# Patient Record
Sex: Female | Born: 1935 | Race: White | Hispanic: No | Smoking: Former smoker
Health system: Southern US, Community
[De-identification: ages and names within clinical notes are randomized; demographics above are authoritative.]

## PROBLEM LIST (undated history)

## (undated) DIAGNOSIS — C50919 Malignant neoplasm of unspecified site of unspecified female breast: Secondary | ICD-10-CM

## (undated) DIAGNOSIS — I10 Essential (primary) hypertension: Secondary | ICD-10-CM

## (undated) DIAGNOSIS — M24159 Other articular cartilage disorders, unspecified hip: Secondary | ICD-10-CM

## (undated) HISTORY — DX: Malignant neoplasm of unspecified site of unspecified female breast: C50.919

## (undated) HISTORY — PX: TONSILLECTOMY: SUR1361

## (undated) HISTORY — DX: Essential (primary) hypertension: I10

## (undated) HISTORY — DX: Other articular cartilage disorders, unspecified hip: M24.159

## (undated) HISTORY — PX: APPENDECTOMY: SHX54

## (undated) HISTORY — PX: BREAST SURGERY: SHX581

---

## 1991-10-19 DIAGNOSIS — C50919 Malignant neoplasm of unspecified site of unspecified female breast: Secondary | ICD-10-CM

## 1991-10-19 HISTORY — DX: Malignant neoplasm of unspecified site of unspecified female breast: C50.919

## 2009-01-22 ENCOUNTER — Ambulatory Visit: Payer: Self-pay | Admitting: Diagnostic Radiology

## 2009-01-22 ENCOUNTER — Ambulatory Visit (HOSPITAL_BASED_OUTPATIENT_CLINIC_OR_DEPARTMENT_OTHER): Admission: RE | Admit: 2009-01-22 | Discharge: 2009-01-22 | Payer: Self-pay | Admitting: Family Medicine

## 2010-10-24 ENCOUNTER — Emergency Department (HOSPITAL_COMMUNITY)
Admission: EM | Admit: 2010-10-24 | Discharge: 2010-10-24 | Disposition: A | Discharge status: Other Acute Inpatient Hospital | Payer: Self-pay | Source: Home / Self Care | Admitting: Emergency Medicine

## 2010-11-02 LAB — COMPREHENSIVE METABOLIC PANEL
ALT: 21 U/L (ref 0–35)
AST: 23 U/L (ref 0–37)
Albumin: 3.8 g/dL (ref 3.5–5.2)
Alkaline Phosphatase: 73 U/L (ref 39–117)
BUN: 19 mg/dL (ref 6–23)
CO2: 26 mEq/L (ref 19–32)
Calcium: 9.7 mg/dL (ref 8.4–10.5)
Chloride: 101 mEq/L (ref 96–112)
Creatinine, Ser: 1.06 mg/dL (ref 0.4–1.2)
GFR calc Af Amer: >60 mL/min (ref >60)
GFR calc non Af Amer: 50 mL/min — ABNORMAL LOW (ref >60)
Glucose, Bld: 136 mg/dL — ABNORMAL HIGH (ref 70–99)
Potassium: 4 mEq/L (ref 3.5–5.1)
Sodium: 138 mEq/L (ref 135–145)
Total Bilirubin: 0.8 mg/dL (ref 0.3–1.2)
Total Protein: 7.5 g/dL (ref 6.0–8.3)

## 2010-11-02 LAB — CBC
HCT: 41.2 % (ref 36.0–46.0)
Hemoglobin: 14.2 g/dL (ref 12.0–15.0)
MCH: 31.6 pg (ref 26.0–34.0)
MCHC: 34.5 g/dL (ref 30.0–36.0)
MCV: 91.6 fL (ref 78.0–100.0)
Platelets: 290 10*3/uL (ref 150–400)
RBC: 4.5 MIL/uL (ref 3.87–5.11)
RDW: 12.9 % (ref 11.5–15.5)
WBC: 10.2 10*3/uL (ref 4.0–10.5)

## 2010-11-02 LAB — URINALYSIS, ROUTINE W REFLEX MICROSCOPIC
Bilirubin Urine: NEGATIVE
Nitrite: POSITIVE — AB
Protein, ur: NEGATIVE mg/dL
Specific Gravity, Urine: 1.016 (ref 1.005–1.030)
Urine Glucose, Fasting: NEGATIVE mg/dL
Urobilinogen, UA: 0.2 mg/dL (ref 0.0–1.0)
pH: 6.5 (ref 5.0–8.0)

## 2010-11-02 LAB — TYPE AND SCREEN
ABO/RH(D): O POS
Antibody Screen: NEGATIVE

## 2010-11-02 LAB — URINE MICROSCOPIC-ADD ON

## 2010-11-02 LAB — URINE CULTURE
Colony Count: >=100000
Culture  Setup Time: 201201080027

## 2010-11-02 LAB — DIFFERENTIAL
Basophils Absolute: 0 10*3/uL (ref 0.0–0.1)
Basophils Relative: 0 % (ref 0–1)
Eosinophils Absolute: 0.2 10*3/uL (ref 0.0–0.7)
Eosinophils Relative: 2 % (ref 0–5)
Lymphocytes Relative: 11 % — ABNORMAL LOW (ref 12–46)
Lymphs Abs: 1.1 10*3/uL (ref 0.7–4.0)
Monocytes Absolute: 0.7 10*3/uL (ref 0.1–1.0)
Monocytes Relative: 6 % (ref 3–12)
Neutro Abs: 8.2 10*3/uL — ABNORMAL HIGH (ref 1.7–7.7)
Neutrophils Relative %: 80 % — ABNORMAL HIGH (ref 43–77)

## 2010-11-02 LAB — ABO/RH: ABO/RH(D): O POS

## 2011-11-30 ENCOUNTER — Encounter: Payer: Self-pay | Admitting: Internal Medicine

## 2012-01-28 ENCOUNTER — Encounter: Payer: Self-pay | Admitting: Internal Medicine

## 2012-05-16 ENCOUNTER — Encounter: Payer: Self-pay | Admitting: Internal Medicine

## 2012-05-16 ENCOUNTER — Ambulatory Visit (INDEPENDENT_AMBULATORY_CARE_PROVIDER_SITE_OTHER): Payer: Medicare Other | Admitting: Internal Medicine

## 2012-05-16 VITALS — BP 150/70 | HR 76 | Ht 63.75 in | Wt 129.4 lb

## 2012-05-16 DIAGNOSIS — Z1211 Encounter for screening for malignant neoplasm of colon: Secondary | ICD-10-CM

## 2012-05-16 NOTE — Patient Instructions (Addendum)
Dr Leonette Most

## 2012-05-16 NOTE — Progress Notes (Signed)
Andrea Lam 11/02/1928 MRN [id number]   History of Present Illness:  This is an 78 year old white female who is here to discuss having a recall colonoscopy. She had her last screening colonoscopy in July 2003. She had moderately severe diverticulosis of the left colon. There were no polyps. There is no family history of colon cancer. Her bowel habits are regular. She denies rectal bleeding. She had a regular physical exam by Dr. Leonette Most. Her next one is scheduled in September 2013. She denies being anemic.   Past Medical History  Diagnosis Date  . Hypertension   . Articular cartilage disorder of hip     left  . Breast cancer 1993    left   Past Surgical History  Procedure Date  . Breast surgery     removed left breast  . Appendectomy   . Tonsillectomy     reports that she quit smoking about 70 years ago. She has never used smokeless tobacco. She reports that she drinks alcohol. She reports that she does not use illicit drugs. family history includes Breast cancer in her sister; Diabetes in her sister; Heart disease in her father; and Hypertension in her mother.  There is no history of Colon cancer. No Known Allergies      Review of Systems:  The remainder of the 10 point ROS is negative except as outlined in H&P   Physical Exam: General appearance  Well developed, in no distress. Eyes- non icteric. HEENT nontraumatic, normocephalic. Mouth no lesions, tongue papillated, no cheilosis. Neck supple without adenopathy, thyroid not enlarged, no carotid bruits, no JVD. Lungs Clear to auscultation bilaterally. Cor normal S1, normal S2, regular rhythm, no murmur,  quiet precordium. Abdomen: Soft nontender with normal active bowel sounds. No distention. No bruit. Liver edge at costal margin. Rectal: Normal rectal sphincter tone. Soft Hemoccult negative stool. Extremities no pedal edema. Skin no lesions. Neurological alert and oriented x 3. Psychological normal mood and  affect.  Assessment and Plan:  Problem #26 78 year old white female who is asymptomatic and due for a recall colonoscopy. She is in good health but is not sure that she wants another colonoscopy. She is Hemoccult negative. I have asked her to discuss it with Dr Leonette Most at her next appointment. I have discussed the benefits as well as the risks of colonoscopy. At her age, it is her choice whether she wants to proceed with colonoscopy. I told her that I cannot be sure that she may not have colon cancer or a dysplastic polyp and she asked what symptoms she should anticipate if there was a problem. I explained that the purpose of screening colonoscopy is to prevent  Problems rather than wait for the symptoms. She will call us back if she is interested in pursuing a colonoscopy.    05/16/2012 Lina Sar

## 2013-11-03 ENCOUNTER — Emergency Department (HOSPITAL_BASED_OUTPATIENT_CLINIC_OR_DEPARTMENT_OTHER): Payer: Medicare HMO

## 2013-11-03 ENCOUNTER — Encounter (HOSPITAL_BASED_OUTPATIENT_CLINIC_OR_DEPARTMENT_OTHER): Payer: Self-pay | Admitting: Emergency Medicine

## 2013-11-03 ENCOUNTER — Emergency Department (HOSPITAL_BASED_OUTPATIENT_CLINIC_OR_DEPARTMENT_OTHER)
Admission: EM | Admit: 2013-11-03 | Discharge: 2013-11-04 | Payer: Medicare HMO | Attending: Emergency Medicine | Admitting: Emergency Medicine

## 2013-11-03 DIAGNOSIS — J3489 Other specified disorders of nose and nasal sinuses: Secondary | ICD-10-CM | POA: Insufficient documentation

## 2013-11-03 DIAGNOSIS — Z79899 Other long term (current) drug therapy: Secondary | ICD-10-CM | POA: Insufficient documentation

## 2013-11-03 DIAGNOSIS — Z853 Personal history of malignant neoplasm of breast: Secondary | ICD-10-CM | POA: Insufficient documentation

## 2013-11-03 DIAGNOSIS — R778 Other specified abnormalities of plasma proteins: Secondary | ICD-10-CM

## 2013-11-03 DIAGNOSIS — R7989 Other specified abnormal findings of blood chemistry: Secondary | ICD-10-CM | POA: Insufficient documentation

## 2013-11-03 DIAGNOSIS — J40 Bronchitis, not specified as acute or chronic: Secondary | ICD-10-CM | POA: Insufficient documentation

## 2013-11-03 DIAGNOSIS — R509 Fever, unspecified: Secondary | ICD-10-CM | POA: Insufficient documentation

## 2013-11-03 DIAGNOSIS — R000 Tachycardia, unspecified: Secondary | ICD-10-CM | POA: Insufficient documentation

## 2013-11-03 DIAGNOSIS — Z7982 Long term (current) use of aspirin: Secondary | ICD-10-CM | POA: Insufficient documentation

## 2013-11-03 DIAGNOSIS — Z87891 Personal history of nicotine dependence: Secondary | ICD-10-CM | POA: Insufficient documentation

## 2013-11-03 DIAGNOSIS — I10 Essential (primary) hypertension: Secondary | ICD-10-CM | POA: Insufficient documentation

## 2013-11-03 DIAGNOSIS — M542 Cervicalgia: Secondary | ICD-10-CM | POA: Insufficient documentation

## 2013-11-03 LAB — COMPREHENSIVE METABOLIC PANEL
ALT: 12 U/L (ref 0–35)
AST: 23 U/L (ref 0–37)
Albumin: 3.8 g/dL (ref 3.5–5.2)
Alkaline Phosphatase: 78 U/L (ref 39–117)
BUN: 15 mg/dL (ref 6–23)
CO2: 25 mEq/L (ref 19–32)
Calcium: 9.4 mg/dL (ref 8.4–10.5)
Chloride: 102 mEq/L (ref 96–112)
Creatinine, Ser: 0.9 mg/dL (ref 0.50–1.10)
GFR calc Af Amer: 67 mL/min — ABNORMAL LOW (ref >90)
GFR calc non Af Amer: 58 mL/min — ABNORMAL LOW (ref >90)
Glucose, Bld: 118 mg/dL — ABNORMAL HIGH (ref 70–99)
Potassium: 3.7 mEq/L (ref 3.7–5.3)
Sodium: 143 mEq/L (ref 137–147)
Total Bilirubin: 0.6 mg/dL (ref 0.3–1.2)
Total Protein: 7.7 g/dL (ref 6.0–8.3)

## 2013-11-03 LAB — TROPONIN I
TROPONIN I: 0.32 ng/mL — AB (ref <0.30)
Troponin I: 0.31 ng/mL (ref <0.30)

## 2013-11-03 LAB — CBC WITH DIFFERENTIAL/PLATELET
Basophils Absolute: 0 10*3/uL (ref 0.0–0.1)
Basophils Relative: 0 % (ref 0–1)
Eosinophils Absolute: 0 10*3/uL (ref 0.0–0.7)
Eosinophils Relative: 0 % (ref 0–5)
HCT: 43.1 % (ref 36.0–46.0)
Hemoglobin: 14.3 g/dL (ref 12.0–15.0)
Lymphocytes Relative: 6 % — ABNORMAL LOW (ref 12–46)
Lymphs Abs: 0.4 10*3/uL — ABNORMAL LOW (ref 0.7–4.0)
MCH: 30.2 pg (ref 26.0–34.0)
MCHC: 33.2 g/dL (ref 30.0–36.0)
MCV: 90.9 fL (ref 78.0–100.0)
Monocytes Absolute: 1 10*3/uL (ref 0.1–1.0)
Monocytes Relative: 13 % — ABNORMAL HIGH (ref 3–12)
Neutro Abs: 6.1 10*3/uL (ref 1.7–7.7)
Neutrophils Relative %: 81 % — ABNORMAL HIGH (ref 43–77)
Platelets: 240 10*3/uL (ref 150–400)
RBC: 4.74 MIL/uL (ref 3.87–5.11)
RDW: 13.6 % (ref 11.5–15.5)
WBC: 7.5 10*3/uL (ref 4.0–10.5)

## 2013-11-03 LAB — D-DIMER, QUANTITATIVE: D-Dimer, Quant: 0.98 ug/mL-FEU — ABNORMAL HIGH (ref 0.00–0.48)

## 2013-11-03 MED ORDER — ASPIRIN 81 MG PO CHEW
324.0000 mg | CHEWABLE_TABLET | Freq: Once | ORAL | Status: AC
  Administered 2013-11-03: 324 mg via ORAL
  Filled 2013-11-03: qty 4

## 2013-11-03 MED ORDER — ALBUTEROL SULFATE HFA 108 (90 BASE) MCG/ACT IN AERS
2.0000 | INHALATION_SPRAY | RESPIRATORY_TRACT | Status: DC | PRN
  Administered 2013-11-03: 2 via RESPIRATORY_TRACT
  Filled 2013-11-03 (×2): qty 6.7

## 2013-11-03 MED ORDER — ACETAMINOPHEN 325 MG PO TABS
ORAL_TABLET | ORAL | Status: AC
  Administered 2013-11-03: 650 mg
  Filled 2013-11-03: qty 2

## 2013-11-03 MED ORDER — IOHEXOL 350 MG/ML SOLN: 80.0000 mL | Freq: Once | INTRAVENOUS | Status: AC | PRN

## 2013-11-03 MED ORDER — LEVOFLOXACIN IN D5W 500 MG/100ML IV SOLN
500.0000 mg | Freq: Once | INTRAVENOUS | Status: AC
  Administered 2013-11-04: 500 mg via INTRAVENOUS
  Filled 2013-11-03: qty 100

## 2013-11-03 NOTE — ED Notes (Signed)
Report given given to [facility] via phone

## 2013-11-03 NOTE — ED Notes (Signed)
Troponin 0.32. EDP made aware.

## 2013-11-03 NOTE — ED Provider Notes (Signed)
CSN: [id number]     Arrival date & time 11/03/13  1950 History  This chart was scribed for Ezequiel Essex, MD by Maree Erie, ED Scribe. The patient was seen in room MH03/MH03. Patient's care was started at 8:36 PM.    Chief Complaint  Patient presents with  . Cough    Patient is a 80 y.o. female presenting with cough. The history is provided by the patient. No language interpreter was used.  Cough   HPI Comments: Andrea Lam is a 80 y.o. female who presents to the Emergency Department complaining of a constant, dry cough that began yesterday. She reports associated mild congestion and pain in her neck, described as sore. She also has not eaten yet today. She denies fever, but recorded temperature in ED was 100.4 She denies chest pain, shortness of breath, vomiting, sore throat or back pain. She states she had a similar cough last year that was diagnosed as acute bronchitis. She denies a history of heart or lung problems. She currently takes medication for hypertension. She denies sick contacts. She denies recent travel.   Past Medical History  Diagnosis Date  . Hypertension   . Articular cartilage disorder of hip     left  . Breast cancer 1993    left   Past Surgical History  Procedure Laterality Date  . Breast surgery      removed left breast  . Appendectomy    . Tonsillectomy     Family History  Problem Relation Age of Onset  . Colon cancer Neg Hx   . Hypertension Mother   . Heart disease Father   . Diabetes Sister   . Breast cancer Sister     developed into lung cancer   History  Substance Use Topics  . Smoking status: Former Smoker    Quit date: 10/18/1941  . Smokeless tobacco: Never Used  . Alcohol Use: Yes     Comment: very rarely   OB History   Grav Para Term Preterm Abortions TAB SAB Ect Mult Living                 Review of Systems A complete 10 system review of systems was obtained and all systems are negative except as noted in the HPI and  PMH.    Allergies  Review of patient's allergies indicates no known allergies.  Home Medications   Current Outpatient Rx  Name  Route  Sig  Dispense  Refill  . B Complex Vitamins (VITAMIN B COMPLEX PO)   Oral   Take by mouth daily.         Marland Kitchen BABY ASPIRIN PO   Oral   Take by mouth daily.         Marland Kitchen CALCIUM & MAGNESIUM CARBONATES PO   Oral   Take by mouth daily.         . Cholecalciferol (VITAMIN D-3 PO)   Oral   Take by mouth daily.         Marland Kitchen LISINOPRIL PO   Oral   Take by mouth. Takes 2 pills every day         . MELOXICAM PO   Oral   Take by mouth as needed.         . Red Yeast Rice Extract (RED YEAST RICE PO)   Oral   Take by mouth. Takes 2 pills every day          Triage Vitals: BP 156/82  Pulse 108  Temp(Src)  100.4 F (38 C) (Oral)  Resp 24  Wt 122 lb (55.339 kg)  SpO2 100%  Physical Exam  Nursing note and vitals reviewed. Constitutional: She is oriented to person, place, and time. She appears well-developed and well-nourished. No distress.  Dry cough  HENT:  Head: Normocephalic and atraumatic.  Mouth/Throat: Oropharynx is clear and moist. No oropharyngeal exudate.  Eyes: Conjunctivae and EOM are normal. Pupils are equal, round, and reactive to light.  Neck: Normal range of motion. Neck supple. No tracheal deviation present.  No meningismus   Cardiovascular: Regular rhythm and normal heart sounds.  Tachycardia present.   No murmur heard. Pulmonary/Chest: Effort normal and breath sounds normal. No respiratory distress. She has no wheezes. She has no rales.  Abdominal: Soft. There is no tenderness. There is no rebound and no guarding.  Musculoskeletal: Normal range of motion.  Neurological: She is alert and oriented to person, place, and time. No cranial nerve deficit. She exhibits normal muscle tone. Coordination normal.  Skin: Skin is warm and dry.  Psychiatric: She has a normal mood and affect. Her behavior is normal.    ED Course   Procedures (including critical care time)  DIAGNOSTIC STUDIES: Oxygen Saturation is 100% on room air, normal by my interpretation.    COORDINATION OF CARE: 8:40 PM -Will order chest x-ray, CBC, CMP, Troponin I and 2 puff albuterol inhaler.  Patient verbalizes understanding and agrees with treatment plan.    Labs Review Labs Reviewed  CBC WITH DIFFERENTIAL - Abnormal; Notable for the following:    Neutrophils Relative % 81 (*)    Lymphocytes Relative 6 (*)    Lymphs Abs 0.4 (*)    Monocytes Relative 13 (*)    All other components within normal limits  COMPREHENSIVE METABOLIC PANEL - Abnormal; Notable for the following:    Glucose, Bld 118 (*)    GFR calc non Af Amer 58 (*)    GFR calc Af Amer 67 (*)    All other components within normal limits  TROPONIN I - Abnormal; Notable for the following:    Troponin I 0.31 (*)    All other components within normal limits  D-DIMER, QUANTITATIVE - Abnormal; Notable for the following:    D-Dimer, Quant 0.98 (*)    All other components within normal limits  TROPONIN I - Abnormal; Notable for the following:    Troponin I 0.32 (*)    All other components within normal limits   Imaging Review Dg Chest 2 View  11/03/2013   CLINICAL DATA:  Nonproductive cough.  EXAM: CHEST  2 VIEW  COMPARISON:  Chest radiograph performed 10/24/2010  FINDINGS: The lungs are well-aerated and clear. There is no evidence of focal opacification, pleural effusion or pneumothorax.  The heart is normal in size; the mediastinal contour is within normal limits. No acute osseous abnormalities are seen. Scattered clips are noted at the left axilla.  IMPRESSION: No acute cardiopulmonary process seen.   Electronically Signed   By: Garald Balding M.D.   On: 11/03/2013 21:35    EKG Interpretation    Date/Time:  Saturday November 03 2013 20:56:55 EST Ventricular Rate:  108 PR Interval:  148 QRS Duration: 74 QT Interval:  328 QTC Calculation: 439 R Axis:   48 Text  Interpretation:  Sinus tachycardia with Premature supraventricular complexes Otherwise normal ECG Rate faster Confirmed by Wyvonnia Dusky  MD, Esdras Delair ([id number]) on 11/03/2013 9:00:49 PM            MDM  1. Bronchitis   2. Elevated troponin    One day history of dry cough. Denies chest pain, fever, shortness of breath. MAXIMUM TEMPERATURE 100.4 in triage. No distress. Lungs clear. Mild tachycardia.  Patient febrile in the ED. Chest x-ray negative. No increased work of breathing. No wheezing. EKG with no ST changes. In and out of multifocal atrial tachycardia. Troponin elevated at 0.31. Patient denies any chest pain or shortness of breath. Aspirin given. No previous history of cardiac problems.  Suspect bronchitis given patient's cough and fever. She started on IV Levaquin. With elevated troponin she'll need admission. Do not suspect ACS so heparin held at this time.  Admission discussed with Dr. Posey Pronto at [facility].   I personally performed the services described in this documentation, which was scribed in my presence. The recorded information has been reviewed and is accurate.    Ezequiel Essex, MD 11/04/13 1158

## 2013-11-03 NOTE — ED Notes (Signed)
Non-productive cough since last night. Denies fever

## 2013-11-03 NOTE — Progress Notes (Signed)
Patient was not ambulated at this time due to an elevated troponin level.

## 2014-06-11 IMAGING — CT CT ANGIO CHEST
2 of 6 series · 19 of 36 positions shown · IV contrast (APPLIED)
Comparison: Chest radiographs dated 11/03/2013

CLINICAL DATA: Shortness of breath, fever, congestion, elevated
D-dimer. History of left breast cancer status post mastectomy.

EXAM:
CT ANGIOGRAPHY CHEST WITH CONTRAST
TECHNIQUE: Multidetector CT imaging of the chest was performed using the
standard protocol during bolus administration of intravenous
contrast. Multiplanar CT image reconstructions including MIPs were
obtained to evaluate the vascular anatomy.
CONTRAST:  80 mL Omnipaque 350 IV

[Series 5: pe 1.0 b26f · axial · 0.73mm/px · z∈[-336,-72]mm · 18 of 294 slices shown]
[im 15/294  lung]
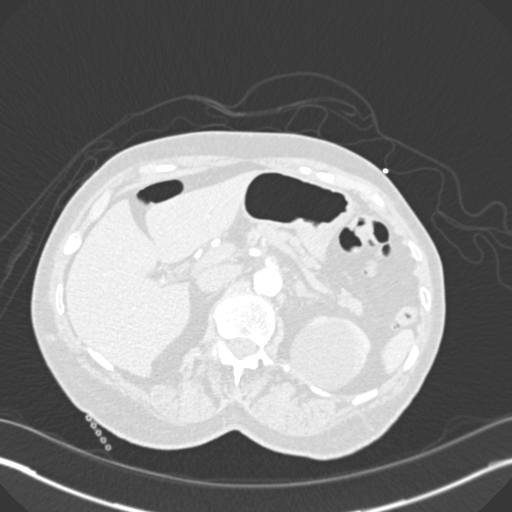
[im 30/294  mediastinal]
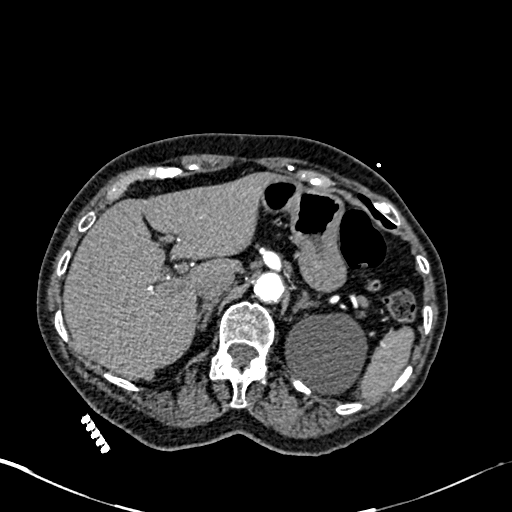
[im 44/294  lung]
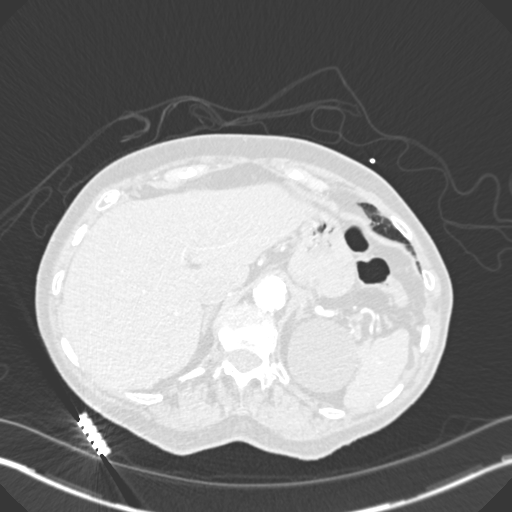
[im 59/294  mediastinal]
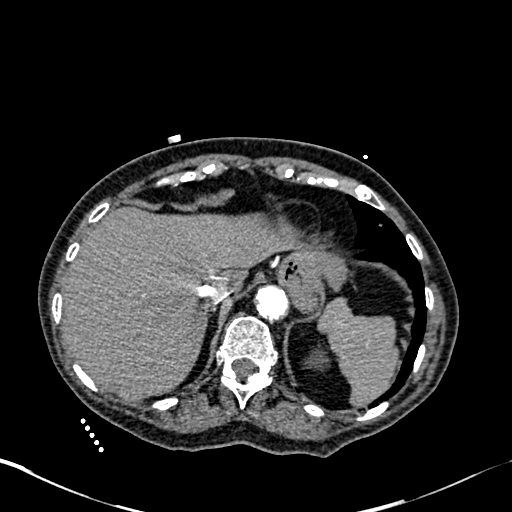
[im 74/294  lung]
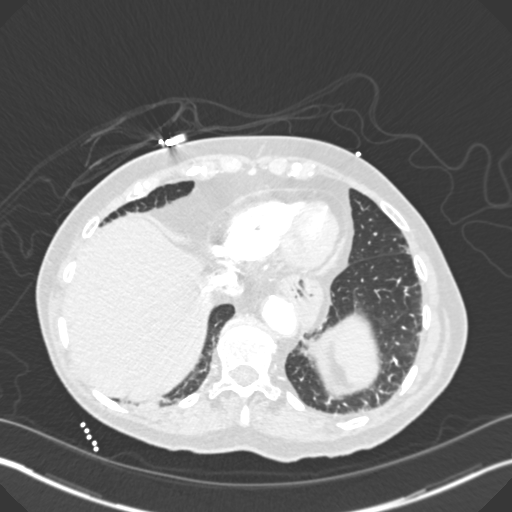
[im 88/294  mediastinal]
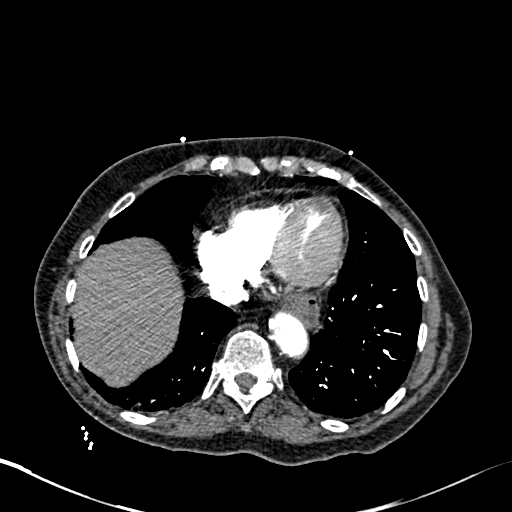
[im 103/294  lung]
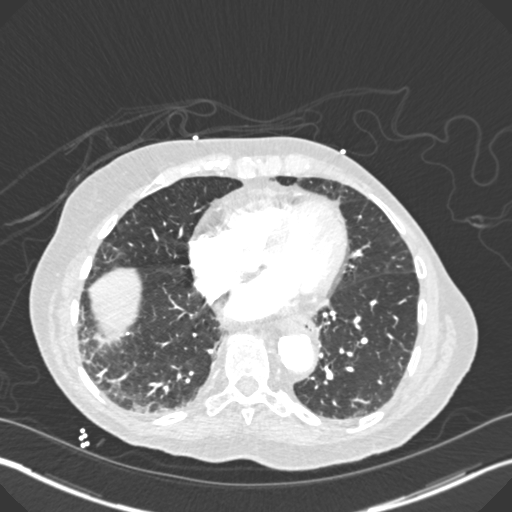
[im 118/294  mediastinal]
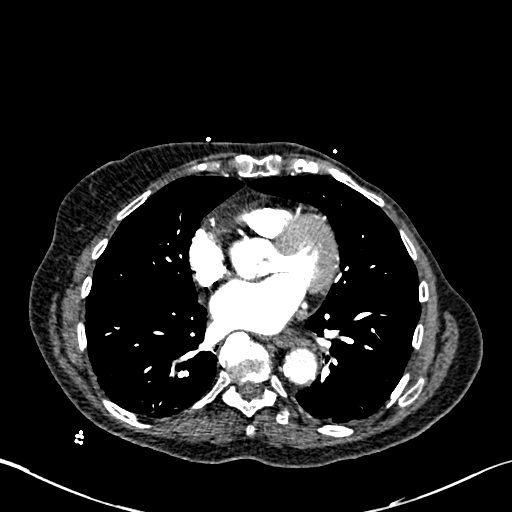
[im 132/294  lung]
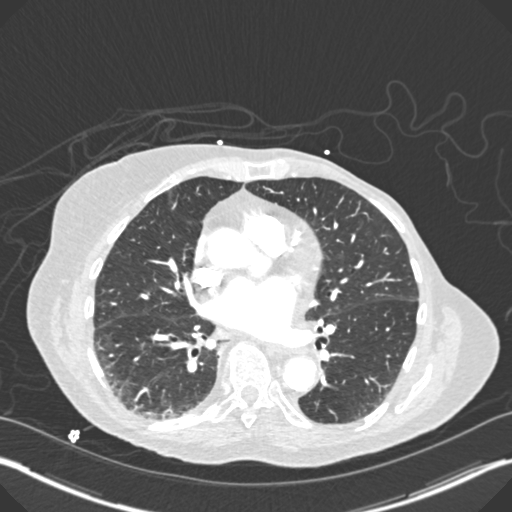
[im 162/294  mediastinal]
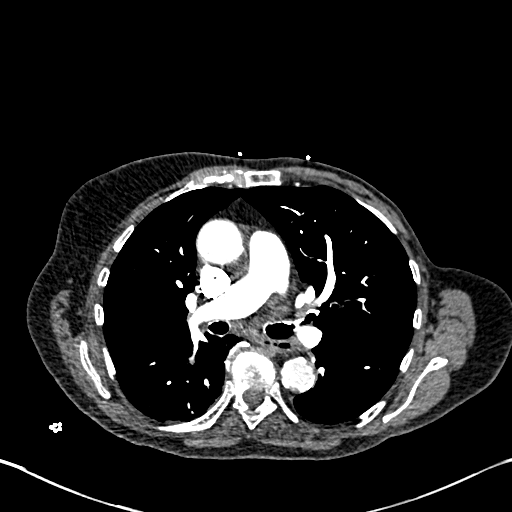
[im 176/294  lung]
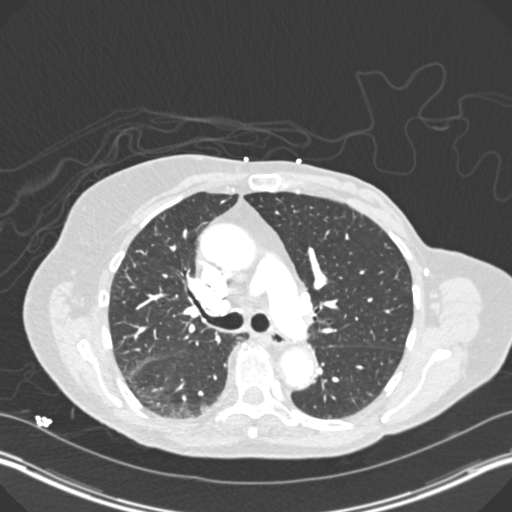
[im 191/294  mediastinal]
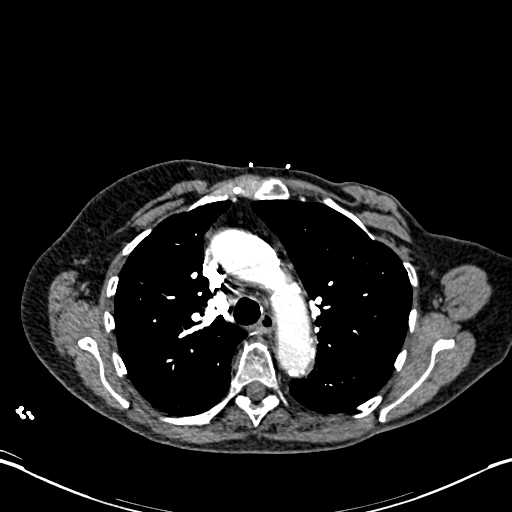
[im 206/294  lung]
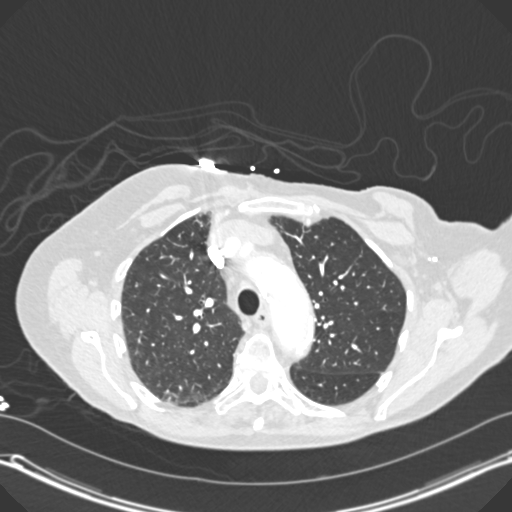
[im 220/294  mediastinal]
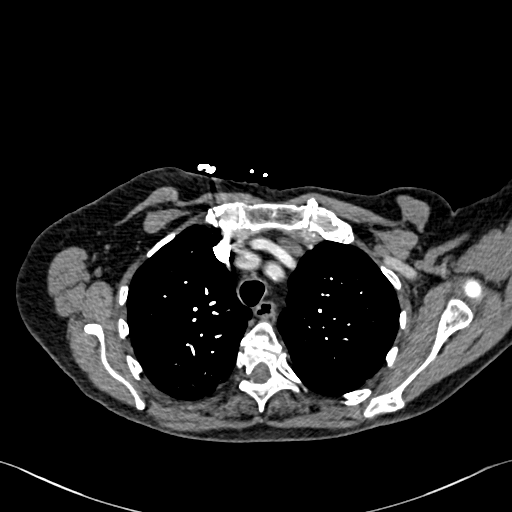
[im 235/294  lung]
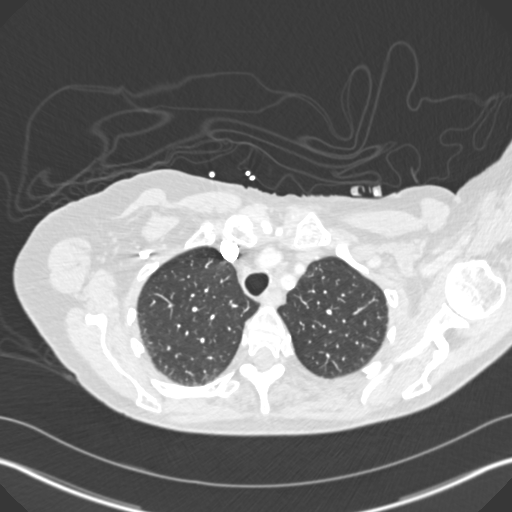
[im 250/294  mediastinal]
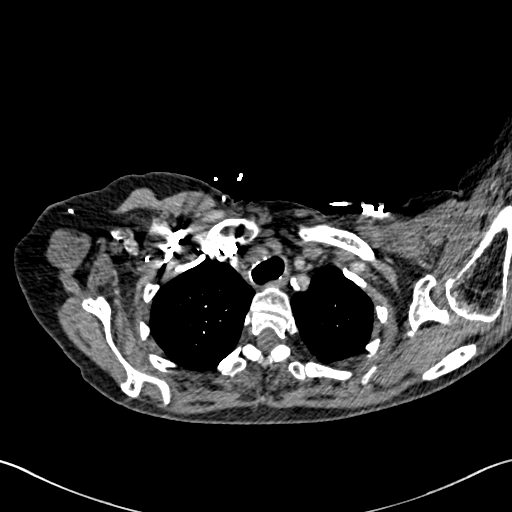
[im 264/294  lung]
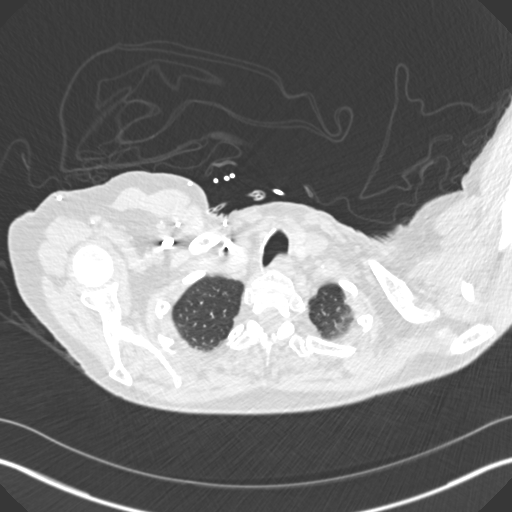
[im 279/294  mediastinal]
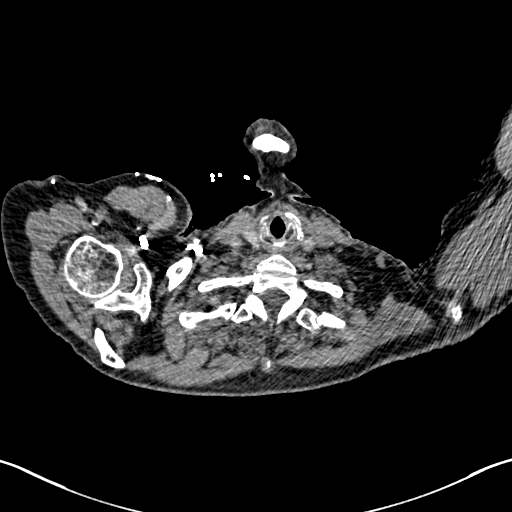

[Series 8: pe 2.0 coronal · coronal · 0.64mm/px · 1 of 110 slices shown]
[im 55/110  mediastinal]
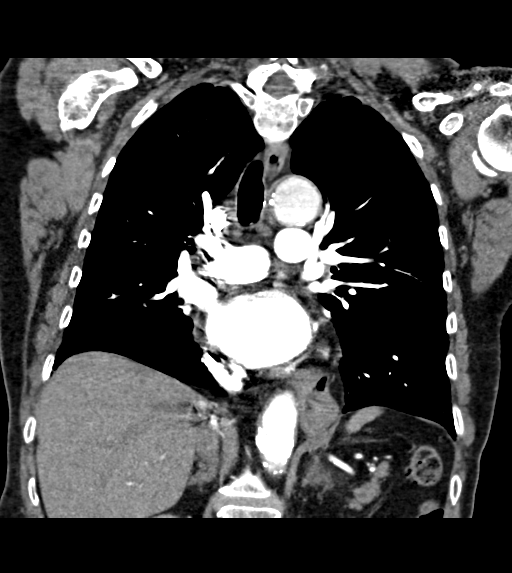

[19 of 36 positions shown; findings below may reference images not displayed]

FINDINGS: No evidence of pulmonary embolism.

Mild subpleural reticulation/dependent atelectasis in the bilateral
lower lobes. No suspicious pulmonary nodules. No pleural effusion or
pneumothorax.

Visualized thyroid is unremarkable.

Heart is normal in size. No pericardial effusion. Coronary
atherosclerosis. Atherosclerotic calcifications of the aortic arch.

Small mediastinal lymph nodes which do not meet pathologic CT size
criteria. No suspicious hilar or axillary lymphadenopathy.

Status post left mastectomy with left axillary lymph node
dissection.

Visualized upper abdomen is notable for a 6.0 cm left upper pole
renal cyst.

Degenerative changes of the visualized thoracolumbar spine.

Review of the MIP images confirms the above findings.
IMPRESSION: No evidence of pulmonary embolism.

Status post left mastectomy with left axillary lymph node
dissection. No evidence of recurrent or metastatic disease in the
chest.

No evidence of acute cardiopulmonary disease.
